# Patient Record
Sex: Female | Born: 1950 | Race: White | Hispanic: No | Marital: Married | Smoking: Current every day smoker
Health system: Southern US, Community
[De-identification: ages and names within clinical notes are randomized; demographics above are authoritative.]

## PROBLEM LIST (undated history)

## (undated) DIAGNOSIS — I10 Essential (primary) hypertension: Secondary | ICD-10-CM

## (undated) DIAGNOSIS — I219 Acute myocardial infarction, unspecified: Secondary | ICD-10-CM

## (undated) DIAGNOSIS — J45909 Unspecified asthma, uncomplicated: Secondary | ICD-10-CM

## (undated) DIAGNOSIS — J449 Chronic obstructive pulmonary disease, unspecified: Secondary | ICD-10-CM

## (undated) HISTORY — PX: ANGIOPLASTY: SHX39

## (undated) HISTORY — PX: NASAL SEPTUM SURGERY: SHX37

---

## 2016-01-27 ENCOUNTER — Emergency Department (INDEPENDENT_AMBULATORY_CARE_PROVIDER_SITE_OTHER)
Admission: EM | Admit: 2016-01-27 | Discharge: 2016-01-27 | Disposition: A | Discharge status: Home / Self Care | Payer: Medicare Other | Source: Home / Self Care | Attending: Family Medicine | Admitting: Family Medicine

## 2016-01-27 ENCOUNTER — Emergency Department (INDEPENDENT_AMBULATORY_CARE_PROVIDER_SITE_OTHER): Payer: Medicare Other

## 2016-01-27 ENCOUNTER — Encounter: Payer: Self-pay | Admitting: *Deleted

## 2016-01-27 DIAGNOSIS — J209 Acute bronchitis, unspecified: Secondary | ICD-10-CM

## 2016-01-27 DIAGNOSIS — R51 Headache: Secondary | ICD-10-CM

## 2016-01-27 DIAGNOSIS — R05 Cough: Secondary | ICD-10-CM

## 2016-01-27 DIAGNOSIS — J441 Chronic obstructive pulmonary disease with (acute) exacerbation: Secondary | ICD-10-CM

## 2016-01-27 DIAGNOSIS — R0902 Hypoxemia: Secondary | ICD-10-CM

## 2016-01-27 DIAGNOSIS — J019 Acute sinusitis, unspecified: Secondary | ICD-10-CM | POA: Diagnosis not present

## 2016-01-27 HISTORY — DX: Acute myocardial infarction, unspecified: I21.9

## 2016-01-27 HISTORY — DX: Chronic obstructive pulmonary disease, unspecified: J44.9

## 2016-01-27 HISTORY — DX: Essential (primary) hypertension: I10

## 2016-01-27 HISTORY — DX: Unspecified asthma, uncomplicated: J45.909

## 2016-01-27 MED ORDER — METHYLPREDNISOLONE SODIUM SUCC 40 MG IJ SOLR
80.0000 mg | Freq: Once | INTRAMUSCULAR | Status: AC
  Administered 2016-01-27: 80 mg via INTRAMUSCULAR

## 2016-01-27 MED ORDER — PREDNISONE 20 MG PO TABS
ORAL_TABLET | ORAL | 0 refills | Status: DC
Start: 2016-01-27 — End: 2016-01-27

## 2016-01-27 MED ORDER — IPRATROPIUM-ALBUTEROL 0.5-2.5 (3) MG/3ML IN SOLN
3.0000 mL | Freq: Once | RESPIRATORY_TRACT | Status: AC
  Administered 2016-01-27: 3 mL via RESPIRATORY_TRACT

## 2016-01-27 MED ORDER — PREDNISONE 20 MG PO TABS: ORAL_TABLET | ORAL | 0 refills | Status: AC

## 2016-01-27 MED ORDER — ALBUTEROL SULFATE HFA 108 (90 BASE) MCG/ACT IN AERS: 1.0000 | INHALATION_SPRAY | Freq: Four times a day (QID) | RESPIRATORY_TRACT | 0 refills | Status: AC | PRN

## 2016-01-27 MED ORDER — DOXYCYCLINE HYCLATE 100 MG PO CAPS: 100.0000 mg | ORAL_CAPSULE | Freq: Two times a day (BID) | ORAL | 0 refills | Status: DC

## 2016-01-27 MED ORDER — DOXYCYCLINE HYCLATE 100 MG PO CAPS: 100.0000 mg | ORAL_CAPSULE | Freq: Two times a day (BID) | ORAL | 0 refills | Status: AC

## 2016-01-27 NOTE — ED Triage Notes (Signed)
Patient c/o 3 days of clear productive cough. Today the sputum turned white. Also c/o HA. Afebrile. Using inhaler and taken tylenol.

## 2016-01-27 NOTE — ED Notes (Signed)
Report called to [facility] charge nurse, Kelly 

## 2016-01-27 NOTE — ED Provider Notes (Signed)
CSN: [id number]     Arrival date & time 01/27/16  1741 History   First MD Initiated Contact with Patient 01/27/16 1802     Chief Complaint  Patient presents with  . Cough  . Headache   (Consider location/radiation/quality/duration/timing/severity/associated sxs/prior Treatment) HPI  Sophia Davidson is a 65 y.o. female presenting to UC with c/o 3 days of mild to moderately productive cough with clear sputum that has developed into chest tightness and soreness, and white-pink tinged sputum today.  She also reports sinus pain and pressure for the last 3-4 days, worse on Right side of face.  She has a hx of COPD and has used her inhaler but only minimal relief.  She also reports generalized fatigue and mild headache.  She notes she lives in [place] and drove with her husband to [place] yesterday, stayed the night and just got into town but wanted to be seen before the weekend as her cough has continued to worsen.  Hx of pneumonia over 15 years ago, she was not hospitalized for that.  She does have a nebulizer machine but notes she left that at home in [place] as she was not having SOB prior to leaving home.  She denies leg pain or swelling.     Past Medical History:  Diagnosis Date  . Asthma   . COPD (chronic obstructive pulmonary disease) (HCC)   . Heart attack   . Hypertension    Past Surgical History:  Procedure Laterality Date  . ANGIOPLASTY    . NASAL SEPTUM SURGERY     Family History  Problem Relation Age of Onset  . Asthma Mother   . Cancer Mother     Pancreatic CA  . Myelodysplastic syndrome Father    Social History  Substance Use Topics  . Smoking status: Current Every Day Smoker    Packs/day: 0.02    Types: Cigarettes  . Smokeless tobacco: Current User  . Alcohol use No   OB History    No data available     Review of Systems  Constitutional: Positive for fatigue. Negative for chills and fever.  HENT: Positive for congestion. Negative for ear pain, sore  throat, trouble swallowing and voice change.   Respiratory: Positive for cough, chest tightness and shortness of breath.   Cardiovascular: Negative for chest pain and palpitations.  Gastrointestinal: Negative for abdominal pain, diarrhea, nausea and vomiting.  Musculoskeletal: Negative for arthralgias, back pain and myalgias.  Skin: Negative for rash.    Allergies  Keflex [cephalexin] and Statins  Home Medications   Prior to Admission medications   Medication Sig Start Date End Date Taking? Authorizing Provider  amLODipine (NORVASC) 5 MG tablet Take 5 mg by mouth daily.   Yes Historical Provider, MD  Budesonide-Formoterol Fumarate (SYMBICORT IN) Inhale into the lungs.   Yes Historical Provider, MD  carvedilol (COREG) 3.125 MG tablet Take 3.125 mg by mouth 2 (two) times daily with a meal.   Yes Historical Provider, MD  Cholecalciferol (VITAMIN D PO) Take 2,000 Units by mouth.   Yes Historical Provider, MD  clopidogrel (PLAVIX) 75 MG tablet Take 75 mg by mouth daily.   Yes Historical Provider, MD  Isosorbide Mononitrate (IMDUR PO) Take by mouth.   Yes Historical Provider, MD  albuterol (PROVENTIL HFA;VENTOLIN HFA) 108 (90 Base) MCG/ACT inhaler Inhale 1-2 puffs into the lungs every 6 (six) hours as needed for wheezing or shortness of breath. 01/27/16   Junius FinnerErin O'Malley, PA-C  doxycycline (VIBRAMYCIN) 100 MG capsule Take 1  capsule (100 mg total) by mouth 2 (two) times daily. One po bid x 10 days 01/27/16   Junius FinnerErin O'Malley, PA-C  predniSONE (DELTASONE) 20 MG tablet 3 tabs po day one, then 2 po daily x 4 days 01/27/16   Junius FinnerErin O'Malley, PA-C   Meds Ordered and Administered this Visit   Medications  methylPREDNISolone sodium succinate (SOLU-MEDROL) 40 mg/mL injection 80 mg (80 mg Intramuscular Given 01/27/16 1817)  ipratropium-albuterol (DUONEB) 0.5-2.5 (3) MG/3ML nebulizer solution 3 mL (3 mLs Nebulization Given 01/27/16 1817)    BP 160/88 (BP Location: Left Arm)   Pulse 89   Temp 99 F (37.2  C) (Oral)   Resp 18   Ht 5' 1.5" (1.562 m)   Wt 151 lb (68.5 kg)   SpO2 (!) 88%   BMI 28.07 kg/m  No data found.   Physical Exam  Constitutional: She is oriented to person, place, and time. She appears well-developed and well-nourished. No distress.  HENT:  Head: Normocephalic and atraumatic.  Right Ear: Tympanic membrane normal.  Left Ear: Tympanic membrane normal.  Nose: Mucosal edema present. Right sinus exhibits maxillary sinus tenderness and frontal sinus tenderness. Left sinus exhibits maxillary sinus tenderness and frontal sinus tenderness.  Mouth/Throat: Uvula is midline, oropharynx is clear and moist and mucous membranes are normal.  Eyes: EOM are normal.  Neck: Normal range of motion. Neck supple.  Cardiovascular: Normal rate.   Pulmonary/Chest: Effort normal. No accessory muscle usage. No respiratory distress. She has decreased breath sounds ( throughout ). She has no wheezes. She has no rhonchi.  Musculoskeletal: Normal range of motion.  Neurological: She is alert and oriented to person, place, and time.  Skin: Skin is warm and dry. She is not diaphoretic.  Psychiatric: She has a normal mood and affect. Her behavior is normal.  Nursing note and vitals reviewed.   Urgent Care Course   Clinical Course     Procedures (including critical care time)  Labs Review Labs Reviewed - No data to display  Imaging Review Dg Chest 2 View  Result Date: 01/27/2016 CLINICAL DATA:  Headache and productive cough EXAM: CHEST  2 VIEW COMPARISON:  None. FINDINGS: Lungs are slightly hyperinflated. No focal infiltrate or effusion. Cardiomediastinal silhouette normal. Atherosclerosis of the aorta. No pneumothorax. IMPRESSION: Mild hyperinflation without focal infiltrate Electronically Signed   By: Jasmine PangKim  Fujinaga M.D.   On: 01/27/2016 18:44      MDM   1. Acute bronchitis, unspecified organism   2. Hypoxia   3. Acute rhinosinusitis   4. COPD exacerbation (HCC)    Pt presenting  with cough, headache and chest tightness. O2 Sat on RA in triage 86%, pt is not on home Oxygen.  Tx in UC: Solumedrol 80mg  IM and duoneb  O2 Sat improved to 93% on RA  CXR: mild hyperinflation w/o focal infiltrate Pt does have sinus pain and pressure, will cover for bacterial sinusitis.   At discharge, O2 rechecked- decreased to 88% on RA. Discussed concern for dropping O2.  Pt and [facility] agreeable [facility] and treatment.  Pt left in stable condition. [facility] notified of [facility]'s pending arrival.     Junius Finnerrin O'Malley, Cordelia Poche-C 01/27/16 [id number]

## 2017-12-12 IMAGING — DX DG CHEST 2V
2 series · 2 of 2 positions shown · non-contrast
Comparison: None.

CLINICAL DATA: Headache and productive cough

EXAM:
CHEST  2 VIEW

[chest pa]
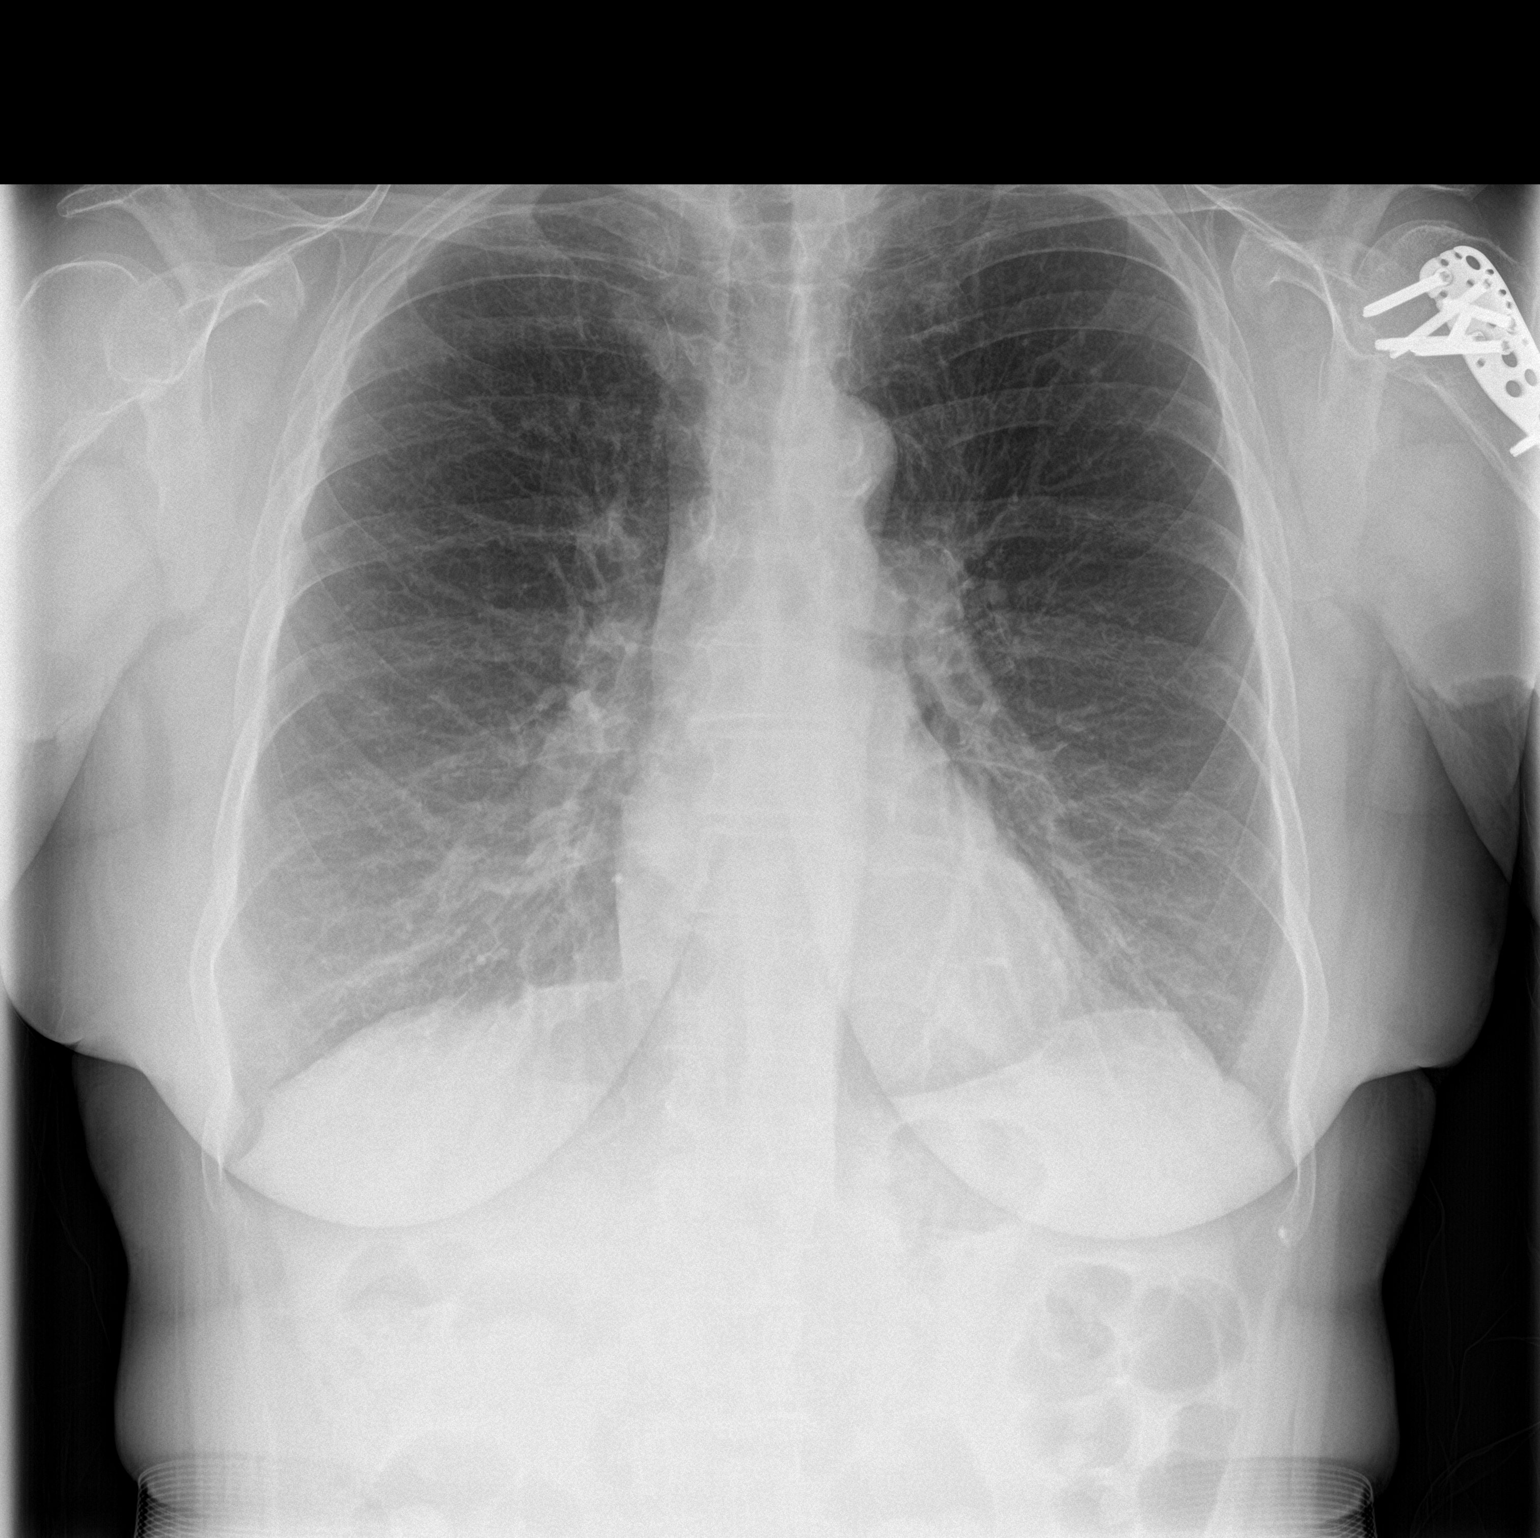

[chest lat]
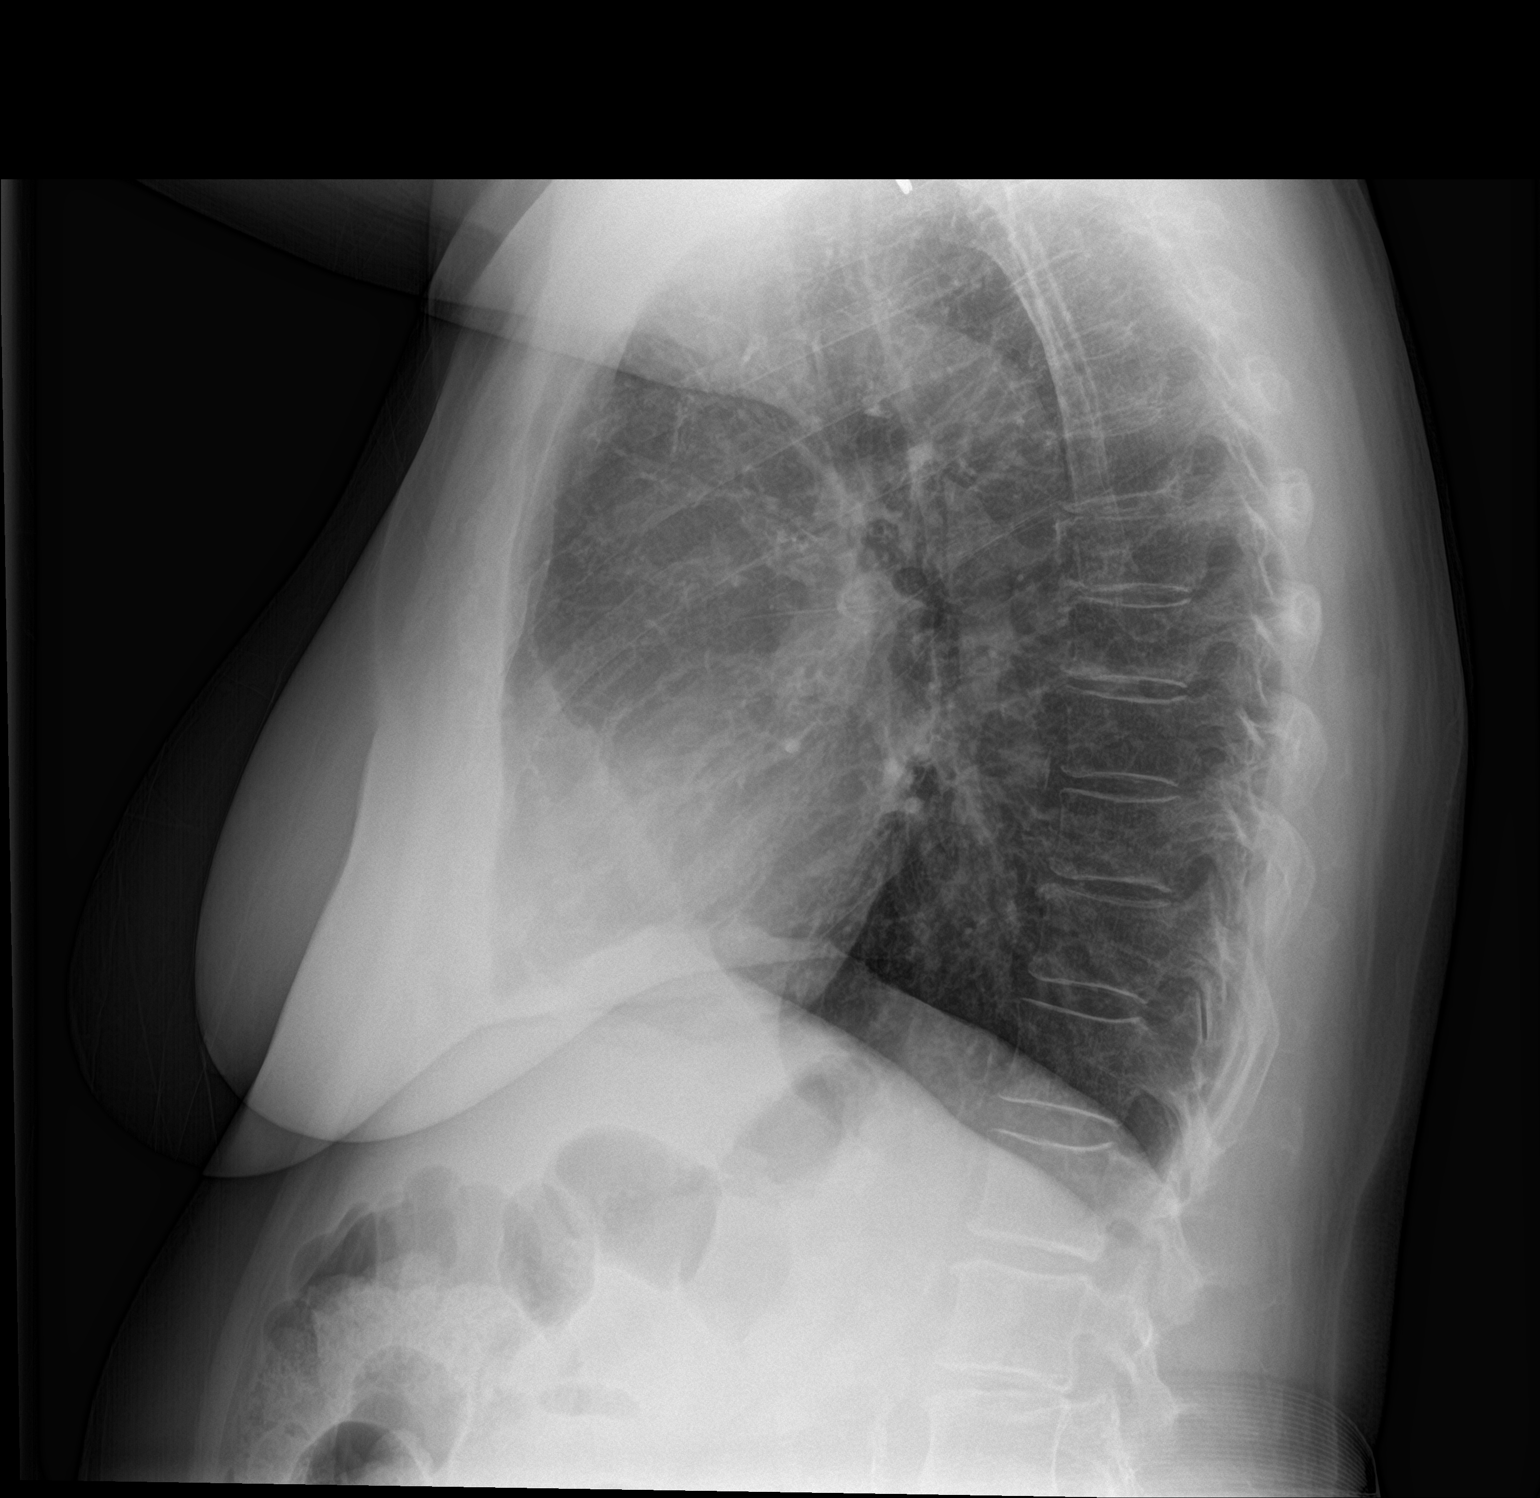

[2 of 2 positions shown; findings below may reference images not displayed]

FINDINGS: Lungs are slightly hyperinflated. No focal infiltrate or effusion.
Cardiomediastinal silhouette normal. Atherosclerosis of the aorta.
No pneumothorax.
IMPRESSION: Mild hyperinflation without focal infiltrate
